# Patient Record
Sex: Female | Born: 2000 | Hispanic: No | Marital: Single | Smoking: Never smoker
Health system: Southern US, Community
[De-identification: ages and names within clinical notes are randomized; demographics above are authoritative.]

## PROBLEM LIST (undated history)

## (undated) DIAGNOSIS — H5210 Myopia, unspecified eye: Secondary | ICD-10-CM

## (undated) HISTORY — DX: Myopia, unspecified eye: H52.10

---
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
## 2022-03-07 NOTE — H&P (Signed)
HPI:  Tracey Barnes is a 21 y.o. female who presents as a return Patient.  Current problem: Tonsils.  HPI: Holiday representative in college. She has had multiple rounds of tonsillitis, some strep positive. Her last 1 was in September. Otherwise in good health.  PMH/Meds/All/SocHx/FamHx/ROS:  History reviewed. No pertinent past medical history.  Past Surgical History: Procedure Laterality Date  NO PAST SURGERIES  No family history of bleeding disorders, wound healing problems or difficulty with anesthesia.  Social History  Socioeconomic History  Marital status: Single Spouse name: Not on file  Number of children: Not on file  Years of education: Not on file  Highest education level: Not on file Occupational History  Not on file Tobacco Use  Smoking status: Never  Smokeless tobacco: Never Vaping Use  Vaping Use: Never used Substance and Sexual Activity  Alcohol use: Yes  Drug use: Not on file  Sexual activity: Not on file Other Topics Concern  Not on file Social History Narrative  Not on file  Social Determinants of Health  Financial Resource Strain: Not on file Food Insecurity: Not on file Transportation Needs: Not on file Physical Activity: Not on file Stress: Not on file Social Connections: Not on file Housing Stability: Not on file  No current outpatient medications on file.   Physical Exam:  Healthy-appearing young lady in no distress. Breathing and voice are clear. Oral cavity and pharynx reveals small symmetric tonsils without inflammation. No adenopathy or neck masses palpable.  Independent Review of Additional Tests or Records: none  Procedures: none  Impression & Plans: Recurrent tonsillitis, consider tonsillectomy.Tracey Barnes meets the indications for tonsillectomy. Risks and benefits were discussed in detail. All questions were answered. A handout was provided with additional details.

## 2022-03-08 ENCOUNTER — Other Ambulatory Visit: Payer: Self-pay

## 2022-03-08 ENCOUNTER — Encounter (HOSPITAL_COMMUNITY): Payer: Self-pay | Admitting: Otolaryngology

## 2022-03-08 NOTE — Progress Notes (Signed)
PCP - denies Cardiologist - denies  Chest x-ray - n/a EKG - n/a  CPAP - n/a  Fasting Blood Sugar - n/a  Blood Thinner Instructions: n/a Aspirin Instructions: Patient was instructed: As of today, STOP taking any Aspirin (unless otherwise instructed by your surgeon) Aleve, Naproxen, Ibuprofen, Motrin, Advil, Goody's, BC's, all herbal medications, fish oil, and all vitamins.  ERAS Protcol - n/a  COVID TEST- n/a  Anesthesia review: no  Patient verbally denies any shortness of breath, fever, cough and chest pain during phone call   -------------  SDW INSTRUCTIONS given:  Your procedure is scheduled on Wednesday, December 27th, 2023.  Report to [facility] Main Entrance "A" at 08:15 A.M., and check in at the Admitting office.  Call this number if you have problems the morning of surgery:  [phone]   Remember:  Do not eat or drink after midnight the night before your surgery     Take these medicines the morning of surgery with A SIP OF WATER - NONE   The day of surgery:                      Do not wear jewelry, make up, or nail polish            Do not wear lotions, powders, perfumes, or deodorant.            Do not shave 48 hours prior to surgery.              Do not bring valuables to the hospital.            [facility] is not responsible for any belongings or valuables.  Do NOT Smoke (Tobacco/Vaping) 24 hours prior to your procedure If you use a CPAP at night, you may bring all equipment for your overnight stay.   Contacts, glasses, dentures or bridgework may not be worn into surgery.      For patients admitted to the hospital, discharge time will be determined by your treatment team.   Patients discharged the day of surgery will not be allowed to drive home, and someone needs to stay with them for 24 hours.    Special instructions:   [facility]- Preparing For Surgery  Before surgery, you can play an important role. Because skin is not sterile, your skin  needs to be as free of germs as possible. You can reduce the number of germs on your skin by washing with CHG (chlorahexidine gluconate) Soap before surgery.  CHG is an antiseptic cleaner which kills germs and bonds with the skin to continue killing germs even after washing.    Oral Hygiene is also important to reduce your risk of infection.  Remember - BRUSH YOUR TEETH THE MORNING OF SURGERY WITH YOUR REGULAR TOOTHPASTE  Please do not use if you have an allergy to CHG or antibacterial soaps. If your skin becomes reddened/irritated stop using the CHG.  Do not shave (including legs and underarms) for at least 48 hours prior to first CHG shower. It is OK to shave your face.  Please follow these instructions carefully.   Shower the NIGHT BEFORE SURGERY and the MORNING OF SURGERY with DIAL Soap.   Pat yourself dry with a CLEAN TOWEL.  Wear CLEAN PAJAMAS to bed the night before surgery  Place CLEAN SHEETS on your bed the night of your first shower and DO NOT SLEEP WITH PETS.   Day of Surgery: Please shower morning of surgery  Wear  Clean/Comfortable clothing the morning of surgery Do not apply any deodorants/lotions.   Remember to brush your teeth WITH YOUR REGULAR TOOTHPASTE.   Questions were answered. Patient verbalized understanding of instructions.

## 2022-03-09 ENCOUNTER — Ambulatory Visit (HOSPITAL_BASED_OUTPATIENT_CLINIC_OR_DEPARTMENT_OTHER): Payer: 59 | Admitting: Anesthesiology

## 2022-03-09 ENCOUNTER — Ambulatory Visit (HOSPITAL_COMMUNITY)
Admission: RE | Admit: 2022-03-09 | Discharge: 2022-03-09 | Disposition: A | Discharge status: Home / Self Care | Payer: 59 | Attending: Otolaryngology | Admitting: Otolaryngology

## 2022-03-09 ENCOUNTER — Encounter (HOSPITAL_COMMUNITY): Payer: Self-pay | Admitting: Otolaryngology

## 2022-03-09 ENCOUNTER — Encounter (HOSPITAL_COMMUNITY)
Admission: RE | Disposition: A | Discharge status: Home / Self Care | Payer: Self-pay | Source: Home / Self Care | Attending: Otolaryngology

## 2022-03-09 ENCOUNTER — Ambulatory Visit (HOSPITAL_COMMUNITY): Payer: 59 | Admitting: Anesthesiology

## 2022-03-09 DIAGNOSIS — J0391 Acute recurrent tonsillitis, unspecified: Secondary | ICD-10-CM | POA: Diagnosis present

## 2022-03-09 HISTORY — PX: TONSILLECTOMY: SHX5217

## 2022-03-09 LAB — CBC
HCT: 46.8 % — ABNORMAL HIGH (ref 36.0–46.0)
Hemoglobin: 14.5 g/dL (ref 12.0–15.0)
MCH: 25.7 pg — ABNORMAL LOW (ref 26.0–34.0)
MCHC: 31 g/dL (ref 30.0–36.0)
MCV: 83 fL (ref 80.0–100.0)
Platelets: 351 10*3/uL (ref 150–400)
RBC: 5.64 MIL/uL — ABNORMAL HIGH (ref 3.87–5.11)
RDW: 14 % (ref 11.5–15.5)
WBC: 8.2 10*3/uL (ref 4.0–10.5)
nRBC: 0 % (ref 0.0–0.2)

## 2022-03-09 LAB — POCT PREGNANCY, URINE: Preg Test, Ur: NEGATIVE

## 2022-03-09 SURGERY — TONSILLECTOMY
Anesthesia: General | Site: Throat | Laterality: Bilateral

## 2022-03-09 MED ORDER — ONDANSETRON 4 MG PO TBDP: 4.0000 mg | ORAL_TABLET | Freq: Three times a day (TID) | ORAL | 0 refills | Status: AC | PRN

## 2022-03-09 MED ORDER — DEXAMETHASONE SODIUM PHOSPHATE 10 MG/ML IJ SOLN
INTRAMUSCULAR | Status: DC | PRN
  Administered 2022-03-09: 10 mg via INTRAVENOUS

## 2022-03-09 MED ORDER — FENTANYL CITRATE (PF) 250 MCG/5ML IJ SOLN
INTRAMUSCULAR | Status: AC
  Filled 2022-03-09: qty 5

## 2022-03-09 MED ORDER — HYDROMORPHONE HCL 1 MG/ML IJ SOLN: 0.2500 mg | INTRAMUSCULAR | Status: DC | PRN

## 2022-03-09 MED ORDER — MIDAZOLAM HCL 2 MG/2ML IJ SOLN: 0.5000 mg | Freq: Once | INTRAMUSCULAR | Status: DC | PRN

## 2022-03-09 MED ORDER — LACTATED RINGERS IV SOLN: INTRAVENOUS | Status: DC

## 2022-03-09 MED ORDER — OXYCODONE HCL 5 MG PO TABS
ORAL_TABLET | ORAL | Status: AC
  Filled 2022-03-09: qty 1

## 2022-03-09 MED ORDER — ACETAMINOPHEN 500 MG PO TABS: 1000.0000 mg | ORAL_TABLET | Freq: Once | ORAL | Status: AC

## 2022-03-09 MED ORDER — OXYCODONE HCL 5 MG/5ML PO SOLN: 5.0000 mg | Freq: Once | ORAL | Status: AC | PRN

## 2022-03-09 MED ORDER — SCOPOLAMINE 1 MG/3DAYS TD PT72
MEDICATED_PATCH | TRANSDERMAL | Status: AC
  Administered 2022-03-09: 1.5 mg via TRANSDERMAL
  Filled 2022-03-09: qty 1

## 2022-03-09 MED ORDER — DEXMEDETOMIDINE HCL IN NACL 80 MCG/20ML IV SOLN
INTRAVENOUS | Status: DC | PRN
  Administered 2022-03-09: 12 ug via BUCCAL

## 2022-03-09 MED ORDER — SUGAMMADEX SODIUM 200 MG/2ML IV SOLN
INTRAVENOUS | Status: DC | PRN
  Administered 2022-03-09: 100 mg via INTRAVENOUS
  Administered 2022-03-09: 200 mg via INTRAVENOUS

## 2022-03-09 MED ORDER — PROPOFOL 10 MG/ML IV BOLUS
INTRAVENOUS | Status: DC | PRN
  Administered 2022-03-09: 200 mg via INTRAVENOUS

## 2022-03-09 MED ORDER — MIDAZOLAM HCL 2 MG/2ML IJ SOLN
INTRAMUSCULAR | Status: AC
  Filled 2022-03-09: qty 2

## 2022-03-09 MED ORDER — FENTANYL CITRATE (PF) 250 MCG/5ML IJ SOLN
INTRAMUSCULAR | Status: DC | PRN
  Administered 2022-03-09: 50 ug via INTRAVENOUS
  Administered 2022-03-09: 100 ug via INTRAVENOUS
  Administered 2022-03-09: 50 ug via INTRAVENOUS

## 2022-03-09 MED ORDER — 0.9 % SODIUM CHLORIDE (POUR BTL) OPTIME
TOPICAL | Status: DC | PRN
  Administered 2022-03-09: 500 mL

## 2022-03-09 MED ORDER — CHLORHEXIDINE GLUCONATE 0.12 % MT SOLN
15.0000 mL | Freq: Once | OROMUCOSAL | Status: AC
  Administered 2022-03-09: 15 mL via OROMUCOSAL
  Filled 2022-03-09: qty 15

## 2022-03-09 MED ORDER — ONDANSETRON HCL 4 MG/2ML IJ SOLN
INTRAMUSCULAR | Status: DC | PRN
  Administered 2022-03-09: 4 mg via INTRAVENOUS

## 2022-03-09 MED ORDER — OXYCODONE HCL 5 MG PO TABS
5.0000 mg | ORAL_TABLET | Freq: Once | ORAL | Status: AC | PRN
  Administered 2022-03-09: 5 mg via ORAL

## 2022-03-09 MED ORDER — ROCURONIUM BROMIDE 10 MG/ML (PF) SYRINGE
PREFILLED_SYRINGE | INTRAVENOUS | Status: DC | PRN
  Administered 2022-03-09: 40 mg via INTRAVENOUS

## 2022-03-09 MED ORDER — SCOPOLAMINE 1 MG/3DAYS TD PT72: 1.0000 | MEDICATED_PATCH | TRANSDERMAL | Status: DC

## 2022-03-09 MED ORDER — MEPERIDINE HCL 25 MG/ML IJ SOLN: 6.2500 mg | INTRAMUSCULAR | Status: DC | PRN

## 2022-03-09 MED ORDER — LIDOCAINE 2% (20 MG/ML) 5 ML SYRINGE
INTRAMUSCULAR | Status: DC | PRN
  Administered 2022-03-09: 60 mg via INTRAVENOUS

## 2022-03-09 MED ORDER — ACETAMINOPHEN 500 MG PO TABS
ORAL_TABLET | ORAL | Status: AC
  Administered 2022-03-09: 1000 mg via ORAL
  Filled 2022-03-09: qty 2

## 2022-03-09 MED ORDER — ORAL CARE MOUTH RINSE: 15.0000 mL | Freq: Once | OROMUCOSAL | Status: AC

## 2022-03-09 MED ORDER — PROMETHAZINE HCL 25 MG/ML IJ SOLN: 6.2500 mg | INTRAMUSCULAR | Status: DC | PRN

## 2022-03-09 MED ORDER — HYDROCODONE-ACETAMINOPHEN 7.5-325 MG/15ML PO SOLN: 10.0000 mL | Freq: Four times a day (QID) | ORAL | 0 refills | Status: AC | PRN

## 2022-03-09 MED ORDER — MIDAZOLAM HCL 2 MG/2ML IJ SOLN
INTRAMUSCULAR | Status: DC | PRN
  Administered 2022-03-09: 2 mg via INTRAVENOUS

## 2022-03-09 SURGICAL SUPPLY — 22 items
CANISTER SUCT 3000ML PPV (MISCELLANEOUS) ×2 IMPLANT
CATH ROBINSON RED A/P 10FR (CATHETERS) ×2 IMPLANT
CLEANER TIP ELECTROSURG 2X2 (MISCELLANEOUS) ×2 IMPLANT
COAGULATOR SUCT SWTCH 10FR 6 (ELECTROSURGICAL) ×2 IMPLANT
ELECT COATED BLADE 2.86 ST (ELECTRODE) ×2 IMPLANT
ELECT REM PT RETURN 9FT ADLT (ELECTROSURGICAL) ×1
ELECTRODE REM PT RTRN 9FT ADLT (ELECTROSURGICAL) IMPLANT
GOWN STRL REUS W/ TWL LRG LVL3 (GOWN DISPOSABLE) ×4 IMPLANT
GOWN STRL REUS W/TWL LRG LVL3 (GOWN DISPOSABLE) ×2
KIT BASIN OR (CUSTOM PROCEDURE TRAY) ×2 IMPLANT
KIT TURNOVER KIT B (KITS) ×2 IMPLANT
NS IRRIG 1000ML POUR BTL (IV SOLUTION) ×2 IMPLANT
PACK BASIC III (CUSTOM PROCEDURE TRAY) ×1
PACK SRG BSC III STRL LF ECLPS (CUSTOM PROCEDURE TRAY) ×2 IMPLANT
PAD ARMBOARD 7.5X6 YLW CONV (MISCELLANEOUS) ×4 IMPLANT
PENCIL FOOT CONTROL (ELECTRODE) ×2 IMPLANT
SPECIMEN JAR SMALL (MISCELLANEOUS) ×4 IMPLANT
SYR BULB EAR ULCER 3OZ GRN STR (SYRINGE) ×2 IMPLANT
TOWEL GREEN STERILE FF (TOWEL DISPOSABLE) ×4 IMPLANT
TUBE CONNECTING 12X1/4 (SUCTIONS) ×2 IMPLANT
TUBE SALEM SUMP 14F W/ARV (TUBING) IMPLANT
WATER STERILE IRR 1000ML POUR (IV SOLUTION) ×2 IMPLANT

## 2022-03-09 NOTE — Anesthesia Postprocedure Evaluation (Signed)
Anesthesia Post Note  Patient: Tracey Barnes  Procedure(s) Performed: TONSILLECTOMY (Bilateral: Throat)     Patient location during evaluation: PACU Anesthesia Type: General Level of consciousness: awake Pain management: pain level controlled Vital Signs Assessment: post-procedure vital signs reviewed and stable Respiratory status: spontaneous breathing, nonlabored ventilation and respiratory function stable Cardiovascular status: blood pressure returned to baseline and stable Postop Assessment: no apparent nausea or vomiting Anesthetic complications: no   No notable events documented.  Last Vitals:  Vitals:   03/09/22 1015 03/09/22 1020  BP: 113/76   Pulse: 91 90  Resp: 14 14  Temp: 36.8 C   SpO2: 97% 97%    Last Pain:  Vitals:   03/09/22 1020  TempSrc:   PainSc: 3                  Linton Rump

## 2022-03-09 NOTE — Anesthesia Procedure Notes (Signed)
Procedure Name: Intubation Date/Time: 03/09/2022 9:26 AM  Performed by: Janace Litten, CRNAPre-anesthesia Checklist: Patient identified, Emergency Drugs available, Suction available and Patient being monitored Patient Re-evaluated:Patient Re-evaluated prior to induction Oxygen Delivery Method: Circle System Utilized Preoxygenation: Pre-oxygenation with 100% oxygen Induction Type: IV induction Ventilation: Mask ventilation without difficulty Laryngoscope Size: Mac and 3 Grade View: Grade I Tube type: Oral Tube size: 7.0 mm Number of attempts: 1 Airway Equipment and Method: Stylet Placement Confirmation: ETT inserted through vocal cords under direct vision, positive ETCO2 and breath sounds checked- equal and bilateral Secured at: 22 cm Tube secured with: Tape Dental Injury: Teeth and Oropharynx as per pre-operative assessment

## 2022-03-09 NOTE — Op Note (Signed)
03/09/2022  9:39 AM  PATIENT:  Tracey Barnes  21 y.o. female  PRE-OPERATIVE DIAGNOSIS:  Recurrent tonsillitis  POST-OPERATIVE DIAGNOSIS:  Recurrent tonsillitis  PROCEDURE:  Procedure(s): TONSILLECTOMY  SURGEON:  Surgeon(s): Serena Colonel, MD  ANESTHESIA:   General  COUNTS: Correct   DICTATION: The patient was taken to the operating room and placed on the operating table in the supine position. Following induction of general endotracheal anesthesia, the table was turned and the patient was draped in a standard fashion. A Crowe-Davis mouthgag was inserted into the oral cavity and used to retract the tongue and mandible, then attached to the Mayo stand.  The tonsillectomy was then performed using electrocautery dissection, carefully dissecting the avascular plane between the capsule and constrictor muscles. Cautery was used for completion of hemostasis. The tonsils were large, cryptic and contained debris , and were discarded.  The pharynx was irrigated with saline and suctioned. An oral gastric tube was used to aspirate the contents of the stomach. The patient was then awakened from anesthesia and transferred to PACU in stable condition.   PATIENT DISPOSITION:  To PACA, stable

## 2022-03-09 NOTE — Anesthesia Preprocedure Evaluation (Addendum)
Anesthesia Evaluation  Patient identified by MRN, date of birth, ID band Patient awake    Reviewed: Allergy & Precautions, NPO status , Patient's Chart, lab work & pertinent test results  History of Anesthesia Complications Negative for: history of anesthetic complications  Airway Mallampati: I  TM Distance: >3 FB Neck ROM: Full    Dental  (+) Dental Advisory Given, Teeth Intact   Pulmonary neg pulmonary ROS   breath sounds clear to auscultation       Cardiovascular negative cardio ROS  Rhythm:Regular Rate:Normal     Neuro/Psych negative neurological ROS     GI/Hepatic negative GI ROS, Neg liver ROS,,,  Endo/Other  negative endocrine ROS    Renal/GU negative Renal ROS     Musculoskeletal   Abdominal   Peds  Hematology negative hematology ROS (+)   Anesthesia Other Findings   Reproductive/Obstetrics LMP 03/01/2022                             Anesthesia Physical Anesthesia Plan  ASA: 1  Anesthesia Plan: General   Post-op Pain Management: Tylenol PO (pre-op)*   Induction: Intravenous  PONV Risk Score and Plan: 3 and Ondansetron, Dexamethasone and Scopolamine patch - Pre-op  Airway Management Planned: Oral ETT  Additional Equipment: None  Intra-op Plan:   Post-operative Plan: Extubation in OR  Informed Consent: I have reviewed the patients History and Physical, chart, labs and discussed the procedure including the risks, benefits and alternatives for the proposed anesthesia with the patient or authorized representative who has indicated his/her understanding and acceptance.     Dental advisory given  Plan Discussed with: CRNA and Surgeon  Anesthesia Plan Comments:        Anesthesia Quick Evaluation

## 2022-03-09 NOTE — Transfer of Care (Signed)
Immediate Anesthesia Transfer of Care Note  Patient: Tracey Barnes  Procedure(s) Performed: TONSILLECTOMY (Bilateral: Throat)  Patient Location: PACU  Anesthesia Type:General  Level of Consciousness: awake, patient cooperative, and responds to stimulation  Airway & Oxygen Therapy: Patient Spontanous Breathing  Post-op Assessment: Report given to RN and Post -op Vital signs reviewed and stable  Post vital signs: Reviewed and stable  Last Vitals:  Vitals Value Taken Time  BP 136/72 03/09/22 0948  Temp    Pulse 129 03/09/22 0950  Resp 15 03/09/22 0950  SpO2 97 % 03/09/22 0950  Vitals shown include unvalidated device data.  Last Pain:  Vitals:   03/09/22 0738  TempSrc:   PainSc: 0-No pain         Complications: No notable events documented.

## 2022-03-09 NOTE — Interval H&P Note (Signed)
History and Physical Interval Note:  03/09/2022 8:43 AM  Tracey Barnes  has presented today for surgery, with the diagnosis of Recurrent tonsillitis.  The various methods of treatment have been discussed with the patient and family. After consideration of risks, benefits and other options for treatment, the patient has consented to  Procedure(s): TONSILLECTOMY (Bilateral) as a surgical intervention.  The patient's history has been reviewed, patient examined, no change in status, stable for surgery.  I have reviewed the patient's chart and labs.  Questions were answered to the patient's satisfaction.     Serena Colonel

## 2022-03-10 ENCOUNTER — Encounter (HOSPITAL_COMMUNITY): Payer: Self-pay | Admitting: Otolaryngology
